# Patient Record
Sex: Male | Born: 1975
Health system: Southern US, Community
[De-identification: ages and names within clinical notes are randomized; demographics above are authoritative.]

## PROBLEM LIST (undated history)

## (undated) HISTORY — PX: WISDOM TOOTH EXTRACTION: SHX21

## (undated) HISTORY — PX: HERNIA REPAIR: SHX51

---

## 2014-03-15 ENCOUNTER — Ambulatory Visit (INDEPENDENT_AMBULATORY_CARE_PROVIDER_SITE_OTHER): Payer: 59 | Admitting: Family Medicine

## 2014-03-15 VITALS — BP 130/70 | HR 79 | Temp 98.2°F | Resp 16 | Ht 69.0 in | Wt 170.0 lb

## 2014-03-15 DIAGNOSIS — R590 Localized enlarged lymph nodes: Secondary | ICD-10-CM

## 2014-03-15 DIAGNOSIS — R599 Enlarged lymph nodes, unspecified: Secondary | ICD-10-CM

## 2014-03-15 LAB — POCT UA - MICROSCOPIC ONLY
Bacteria, U Microscopic: NEGATIVE
Casts, Ur, LPF, POC: NEGATIVE
Crystals, Ur, HPF, POC: NEGATIVE
Mucus, UA: NEGATIVE
RBC, urine, microscopic: NEGATIVE
Yeast, UA: NEGATIVE

## 2014-03-15 LAB — POCT SEDIMENTATION RATE: POCT SED RATE: 40 mm/hr — AB (ref 0–22)

## 2014-03-15 LAB — POCT CBC
Granulocyte percent: 61 %G (ref 37–80)
HCT, POC: 45 % (ref 43.5–53.7)
Hemoglobin: 14.1 g/dL (ref 14.1–18.1)
Lymph, poc: 1.6 (ref 0.6–3.4)
MCH, POC: 26.3 pg — AB (ref 27–31.2)
MCHC: 31.4 g/dL — AB (ref 31.8–35.4)
MCV: 83.8 fL (ref 80–97)
MID (cbc): 0.4 (ref 0–0.9)
MPV: 9.1 fL (ref 0–99.8)
POC Granulocyte: 3.1 (ref 2–6.9)
POC LYMPH PERCENT: 31.9 %L (ref 10–50)
POC MID %: 7.1 %M (ref 0–12)
Platelet Count, POC: 150 10*3/uL (ref 142–424)
RBC: 5.37 M/uL (ref 4.69–6.13)
RDW, POC: 14.4 %
WBC: 5.1 10*3/uL (ref 4.6–10.2)

## 2014-03-15 LAB — POCT URINALYSIS DIPSTICK
Bilirubin, UA: NEGATIVE
Blood, UA: NEGATIVE
Glucose, UA: NEGATIVE
Ketones, UA: NEGATIVE
Leukocytes, UA: NEGATIVE
Nitrite, UA: NEGATIVE
Protein, UA: NEGATIVE
Spec Grav, UA: 1.02
Urobilinogen, UA: 0.2
pH, UA: 7

## 2014-03-15 NOTE — Progress Notes (Signed)
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  He recently returned from vacation in [place].  No sore throat, cough, chest pain, abdominal symptoms, or urinary symptoms.   No ongoing medical problems  Objective:  NAD HEENT:  Normal Neck: supple, adenopathy Chest:  Clear Heart: reg, no murmur or rub Abdomen:  Soft, nontender with no HSM, marked left inguinal adenopathy, right inguinal hernia scar. Results for orders placed in visit on 03/15/14  POCT CBC      Result Value Ref Range   WBC 5.1  4.6 - 10.2 K/uL   Lymph, poc 1.6  0.6 - 3.4   POC LYMPH PERCENT 31.9  10 - 50 %L   MID (cbc) 0.4  0 - 0.9   POC MID % 7.1  0 - 12 %M   POC Granulocyte 3.1  2 - 6.9   Granulocyte percent 61.0  37 - 80 %G   RBC 5.37  4.69 - 6.13 M/uL   Hemoglobin 14.1  14.1 - 18.1 g/dL   HCT, POC 16.1  09.6 - 53.7 %   MCV 83.8  80 - 97 fL   MCH, POC 26.3 (*) 27 - 31.2 pg   MCHC 31.4 (*) 31.8 - 35.4 g/dL   RDW, POC 04.5     Platelet Count, POC 150  142 - 424 K/uL   MPV 9.1  0 - 99.8 fL  POCT URINALYSIS DIPSTICK      Result Value Ref Range   Color, UA yellow     Clarity, UA clear     Glucose, UA neg     Bilirubin, UA neg     Ketones, UA neg     Spec Grav, UA 1.020     Blood, UA neg     pH, UA 7.0     Protein, UA neg     Urobilinogen, UA 0.2     Nitrite, UA neg     Leukocytes, UA Negative    POCT UA - MICROSCOPIC ONLY      Result Value Ref Range   WBC, Ur, HPF, POC 0-1     RBC, urine, microscopic neg     Bacteria, U Microscopic neg     Mucus, UA neg     Epithelial cells, urine per micros 0-1     Crystals, Ur, HPF, POC neg     Casts, Ur, LPF, POC neg     Yeast, UA neg      Assessment:  Unexplained chills and lymphadenopathy.  Lymphadenopathy, abdominal - Plan: POCT CBC, POCT SEDIMENTATION RATE, Comprehensive metabolic panel, POCT urinalysis dipstick, POCT UA - Microscopic Only, US Abdomen Limited  Signed, Elvina Sidle, MD

## 2014-03-15 NOTE — Patient Instructions (Signed)

## 2014-03-16 ENCOUNTER — Other Ambulatory Visit: Payer: Self-pay | Admitting: Family Medicine

## 2014-03-16 ENCOUNTER — Ambulatory Visit
Admission: RE | Admit: 2014-03-16 | Discharge: 2014-03-16 | Disposition: A | Discharge status: Home / Self Care | Payer: 59 | Source: Ambulatory Visit | Attending: Family Medicine | Admitting: Family Medicine

## 2014-03-16 DIAGNOSIS — R590 Localized enlarged lymph nodes: Secondary | ICD-10-CM

## 2014-03-16 LAB — COMPREHENSIVE METABOLIC PANEL
ALT: 26 U/L (ref 0–53)
AST: 26 U/L (ref 0–37)
Albumin: 4.2 g/dL (ref 3.5–5.2)
Alkaline Phosphatase: 59 U/L (ref 39–117)
BUN: 14 mg/dL (ref 6–23)
CO2: 29 mEq/L (ref 19–32)
Calcium: 9.4 mg/dL (ref 8.4–10.5)
Chloride: 104 mEq/L (ref 96–112)
Creat: 0.97 mg/dL (ref 0.50–1.35)
Glucose, Bld: 86 mg/dL (ref 70–99)
Potassium: 4 mEq/L (ref 3.5–5.3)
Sodium: 139 mEq/L (ref 135–145)
Total Bilirubin: 0.4 mg/dL (ref 0.2–1.2)
Total Protein: 7.5 g/dL (ref 6.0–8.3)

## 2014-03-20 ENCOUNTER — Other Ambulatory Visit: Payer: Self-pay | Admitting: Radiology

## 2014-03-20 DIAGNOSIS — R590 Localized enlarged lymph nodes: Secondary | ICD-10-CM

## 2014-03-22 ENCOUNTER — Other Ambulatory Visit: Payer: Self-pay | Admitting: Family Medicine

## 2014-03-22 ENCOUNTER — Other Ambulatory Visit: Payer: Self-pay

## 2014-03-22 DIAGNOSIS — R590 Localized enlarged lymph nodes: Secondary | ICD-10-CM

## 2014-03-24 ENCOUNTER — Other Ambulatory Visit: Payer: Self-pay | Admitting: Radiology

## 2014-03-24 ENCOUNTER — Encounter (HOSPITAL_COMMUNITY): Payer: Self-pay | Admitting: Pharmacy Technician

## 2014-03-27 ENCOUNTER — Other Ambulatory Visit: Payer: Self-pay | Admitting: Radiology

## 2014-03-29 ENCOUNTER — Other Ambulatory Visit: Payer: Self-pay | Admitting: Radiology

## 2014-03-30 ENCOUNTER — Other Ambulatory Visit: Payer: Self-pay | Admitting: Family Medicine

## 2014-03-30 ENCOUNTER — Encounter (HOSPITAL_COMMUNITY): Payer: Self-pay

## 2014-03-30 ENCOUNTER — Ambulatory Visit (HOSPITAL_COMMUNITY)
Admission: RE | Admit: 2014-03-30 | Discharge: 2014-03-30 | Disposition: A | Discharge status: Home / Self Care | Payer: 59 | Source: Ambulatory Visit | Attending: Family Medicine | Admitting: Family Medicine

## 2014-03-30 DIAGNOSIS — R599 Enlarged lymph nodes, unspecified: Secondary | ICD-10-CM | POA: Insufficient documentation

## 2014-03-30 DIAGNOSIS — R590 Localized enlarged lymph nodes: Secondary | ICD-10-CM

## 2014-03-30 LAB — CBC
HCT: 41.8 % (ref 39.0–52.0)
Hemoglobin: 13.9 g/dL (ref 13.0–17.0)
MCH: 26.7 pg (ref 26.0–34.0)
MCHC: 33.3 g/dL (ref 30.0–36.0)
MCV: 80.2 fL (ref 78.0–100.0)
Platelets: 170 10*3/uL (ref 150–400)
RBC: 5.21 MIL/uL (ref 4.22–5.81)
RDW: 13.3 % (ref 11.5–15.5)
WBC: 3.3 10*3/uL — AB (ref 4.0–10.5)

## 2014-03-30 LAB — PROTIME-INR
INR: 1 (ref 0.00–1.49)
PROTHROMBIN TIME: 13.2 s (ref 11.6–15.2)

## 2014-03-30 LAB — APTT: aPTT: 33 seconds (ref 24–37)

## 2014-03-30 MED ORDER — FENTANYL CITRATE 0.05 MG/ML IJ SOLN
INTRAMUSCULAR | Status: AC
  Filled 2014-03-30: qty 2

## 2014-03-30 MED ORDER — LIDOCAINE HCL (PF) 1 % IJ SOLN
INTRAMUSCULAR | Status: AC
  Filled 2014-03-30: qty 5

## 2014-03-30 MED ORDER — MIDAZOLAM HCL 2 MG/2ML IJ SOLN
INTRAMUSCULAR | Status: AC | PRN
  Administered 2014-03-30 (×2): 1 mg via INTRAVENOUS

## 2014-03-30 MED ORDER — FENTANYL CITRATE 0.05 MG/ML IJ SOLN
INTRAMUSCULAR | Status: AC | PRN
  Administered 2014-03-30: 50 ug via INTRAVENOUS

## 2014-03-30 MED ORDER — LIDOCAINE HCL (PF) 1 % IJ SOLN
INTRAMUSCULAR | Status: AC
  Filled 2014-03-30: qty 10

## 2014-03-30 MED ORDER — MIDAZOLAM HCL 2 MG/2ML IJ SOLN
INTRAMUSCULAR | Status: AC
  Filled 2014-03-30: qty 4

## 2014-03-30 MED ORDER — SODIUM CHLORIDE 0.9 % IV SOLN
Freq: Once | INTRAVENOUS | Status: AC
  Administered 2014-03-30: 09:00:00 via INTRAVENOUS

## 2014-03-30 NOTE — Discharge Instructions (Signed)
Needle Biopsy °Care After °These instructions give you information on caring for yourself after your procedure. Your doctor may also give you more specific instructions. Call your doctor if you have any problems or questions after your procedure. °HOME CARE °· Rest for 4 hours after your biopsy, except for getting up to go to the bathroom or as told. °· Keep the places where the needles were put in clean and dry. °¨ Do not put powder or lotion on the sites. °¨ Do not shower until 24 hours after the test. Remove all bandages (dressings) before showering. °¨ Remove all bandages at least once every day. Gently clean the sites with soap and water. Keep putting a new bandage on until the skin is closed. °Finding out the results of your test °Ask your doctor when your test results will be ready. Make sure you follow up and get the test results. °GET HELP RIGHT AWAY IF:  °· You have shortness of breath or trouble breathing. °· You have pain or cramping in your belly (abdomen). °· You feel sick to your stomach (nauseous) or throw up (vomit). °· Any of the places where the needles were put in: °¨ Are puffy (swollen) or red. °¨ Are sore or hot to the touch. °¨ Are draining yellowish-white fluid (pus). °¨ Are bleeding after 10 minutes of pressing down on the site. Have someone keep pressing on any place that is bleeding until you see a doctor. °· You have any unusual pain that will not stop. °· You have a fever. °If you go to the emergency room, tell the nurse that you had a biopsy. Take this paper with you to show the nurse. °MAKE SURE YOU:  °· Understand these instructions. °· Will watch your condition. °· Will get help right away if you are not doing well or get worse. °Document Released: 06/05/2008 Document Revised: 09/15/2011 Document Reviewed: 06/05/2008 °ExitCare® Patient Information ©2015 [employer]. This information is not intended to replace advice given to you by your health care provider. Make sure you discuss  any questions you have with your health care provider. ° °

## 2014-03-30 NOTE — Procedures (Signed)
Procedure: US guided bx/culture of left inguinal lymph node Findings: 3.7cm left inguinal lymph node, architecture maintained by [place].  4 x 18G core biopsy.  2 to path lab, 2 for culture No complication No significant blood loss Signed,  Yvone Neu. Loreta Ave, DO

## 2014-03-30 NOTE — H&P (Signed)
Chief Complaint: "I am here for a biopsy."  Referring Physician(s): Lauenstein,Kurt  History of Present Illness: Jon Young is a 38 y.o. male who c/o left groin swelling starting prior to august 28th before he went on a trip to [place]. He returned from his trip around sept 9th and had 1 week of chills which have now resolved, however continued left groin swelling. He denies any weight loss, appetite change, or night sweats. He underwent a pelvic US and revealed left inguinal adenopathy. He is scheduled today for an US guided lymph node biopsy. He denies any chest pain, shortness of breath or palpitations. He denies any active signs of bleeding or excessive bruising. He denies any recent fever or chills. The patient denies any history of sleep apnea or chronic oxygen use. He has previously tolerated anesthesia during a hernia operation without complications.   History reviewed. No pertinent past medical history.  Past Surgical History  Procedure Laterality Date  . Hernia repair    . Wisdom tooth extraction      Allergies: Review of patient's allergies indicates no known allergies.  Medications: Prior to Admission medications   Medication Sig Start Date End Date Taking? Authorizing Provider  acetaminophen (TYLENOL) 500 MG tablet Take 500 mg by mouth every 4 (four) hours as needed for mild pain, fever or headache.    Yes Historical Provider, MD    Family History  Problem Relation Age of Onset  . Hypertension Mother     History   Social History  . Marital Status: Divorced    Spouse Name: N/A    Number of Children: N/A  . Years of Education: N/A   Social History Main Topics  . Smoking status: Never Smoker   . Smokeless tobacco: None  . Alcohol Use: Yes  . Drug Use: No  . Sexual Activity: None   Other Topics Concern  . None   Social History Narrative  . None   Review of Systems: A 12 point ROS discussed and pertinent positives are indicated in the HPI above.  All  other systems are negative.  Review of Systems  Constitutional: Positive for chills. Negative for fever, appetite change and unexpected weight change.  Respiratory: Negative for cough and shortness of breath.   Cardiovascular: Negative for chest pain.  Gastrointestinal: Negative for abdominal pain and blood in stool.  Genitourinary: Negative for hematuria.    Vital Signs: BP 127/45  Pulse 67  Temp(Src) 97.6 F (36.4 C) (Oral)  Resp 20  Ht  (1.753 m)  Wt 170 lb (77.111 kg)  BMI 25.09 kg/m2  SpO2 100%  Physical Exam  Constitutional: He is oriented to person, place, and time. He appears well-developed and well-nourished. No distress.  HENT:  Head: Normocephalic and atraumatic.  Neck: Neck supple. No tracheal deviation present.  Cardiovascular: Normal rate and regular rhythm.  Exam reveals no gallop and no friction rub.   No murmur heard. Pulmonary/Chest: Effort normal and breath sounds normal. No respiratory distress. He has no wheezes. He has no rales.  Abdominal: Soft. Bowel sounds are normal. He exhibits no distension. There is no tenderness.  Palpable left inguinal adenopathy   Lymphadenopathy:    He has no cervical adenopathy.  Neurological: He is alert and oriented to person, place, and time.  Skin: Skin is warm and dry. He is not diaphoretic.  Psychiatric: He has a normal mood and affect. His behavior is normal. Thought content normal.    Imaging: US Pelvis Limited  03/16/2014   CLINICAL DATA:  Left inguinal adenopathy, recent foreign travel, some chills  EXAM: [place] PELVIS LIMITED  TECHNIQUE: Ultrasound examination of the pelvic soft tissues was performed in the area of clinical concern.  COMPARISON:  None.  FINDINGS: Ultrasound over the left inguinal region was performed. At the site of the palpable abnormality several enlarged lymph nodes are present. The largest of these nodes measures 3.8 x 1.3 x 1.9 cm with a second node measuring 2.9 x 1.1 x 1.7 cm. A third  prominent node measures 1.7 x 1.4 x 2.3 cm. These nodes do demonstrate significant blood flow. I would favor an inflammatory process, although malignancy cannot be excluded. If these nodes persist or enlarge, then biopsy may be warranted.  IMPRESSION: The palpable abnormality in the left inguinal region represents enlarged lymph nodes. An inflammatory etiology is favored. However, recommend clinical followup and biopsy if these nodes persist or enlarge.   Electronically Signed   By: Dwyane Dee M.D.   On: 03/16/2014 15:51    Labs: Lab Results  Component Value Date   WBC 5.1 03/15/2014   HCT 45.0 03/15/2014   MCV 83.8 03/15/2014   NA 139 03/15/2014   K 4.0 03/15/2014   CL 104 03/15/2014   CO2 29 03/15/2014   GLUCOSE 86 03/15/2014   BUN 14 03/15/2014   CREATININE 0.97 03/15/2014   CALCIUM 9.4 03/15/2014   PROT 7.5 03/15/2014   ALBUMIN 4.2 03/15/2014   AST 26 03/15/2014   ALT 26 03/15/2014   ALKPHOS 59 03/15/2014   BILITOT 0.4 03/15/2014    Assessment and Plan: Left inguinal adenopathy s/p pelvic [place] Chills x 1 week Recent travel Scheduled today for image guided left inguinal lymph node biopsy with moderate sedation Patient has been NPO, no blood thinners, labs reviewed. Risks and Benefits discussed with the patient. All of the patient's questions were answered, patient is agreeable to proceed. Consent signed and in chart.    SignedBerneta Levins 03/30/2014, 9:05 AM

## 2014-04-02 LAB — TISSUE CULTURE: Culture: NO GROWTH

## 2014-05-12 LAB — AFB CULTURE WITH SMEAR (NOT AT ARMC): ACID FAST SMEAR: NONE SEEN

## 2014-12-05 IMAGING — US US BIOPSY
1 series · 13 of 15 positions shown · non-contrast
Comparison: none

CLINICAL DATA: 37 year old male with enlarged left inguinal lymph
node

[Series 1: us biopsy · 0.06mm/px · 13 of 15 slices shown]
[im 1/15]
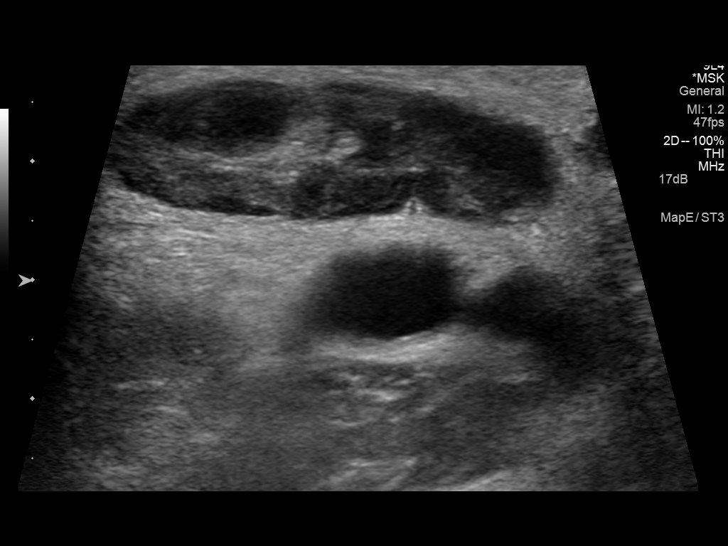
[im 2/15]
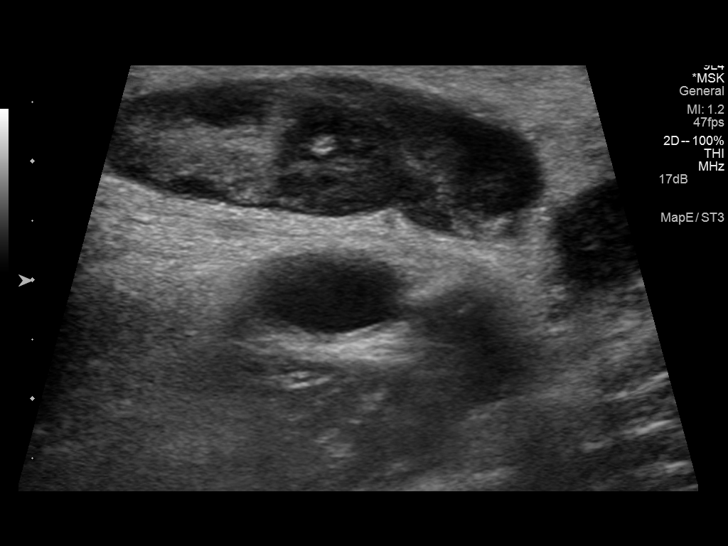
[im 3/15]
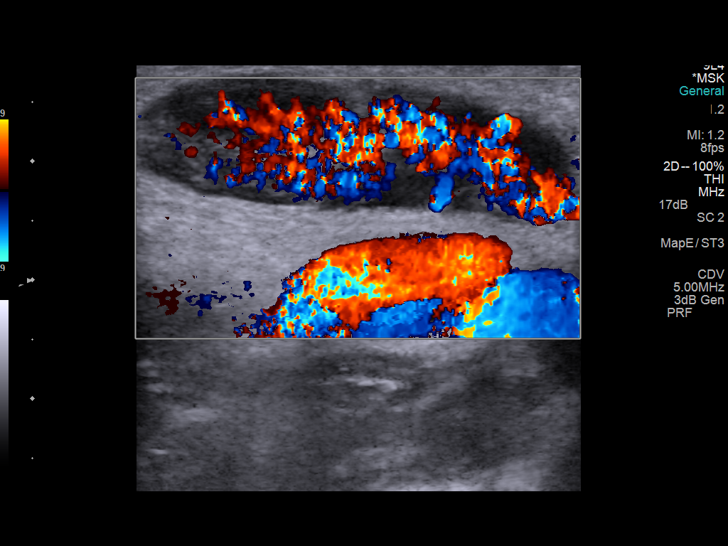
[im 5/15]
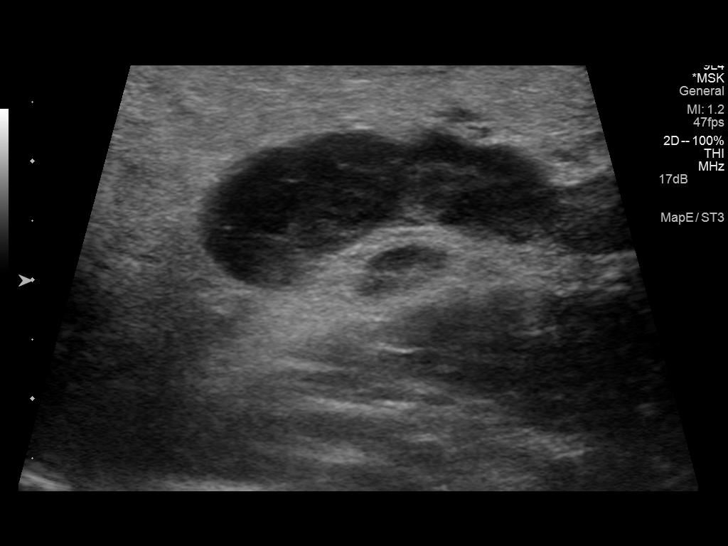
[im 6/15]
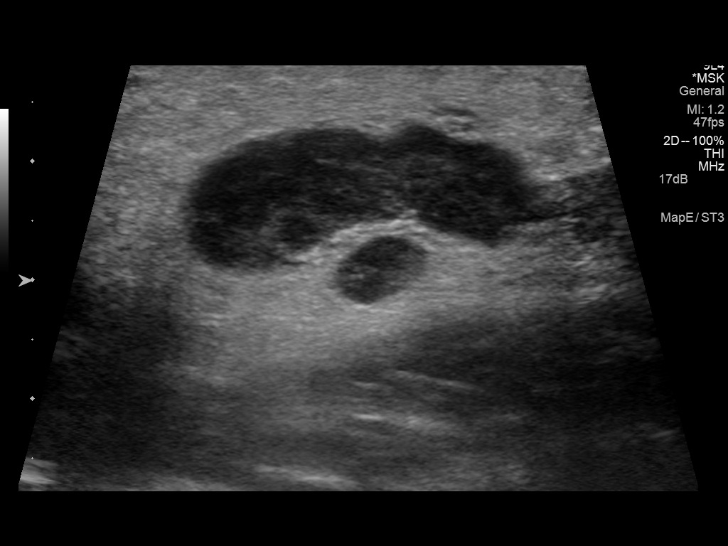
[im 7/15]
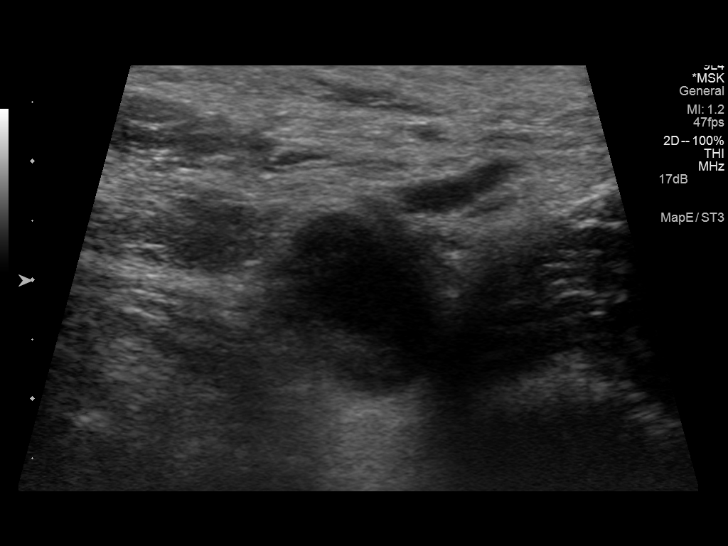
[im 8/15]
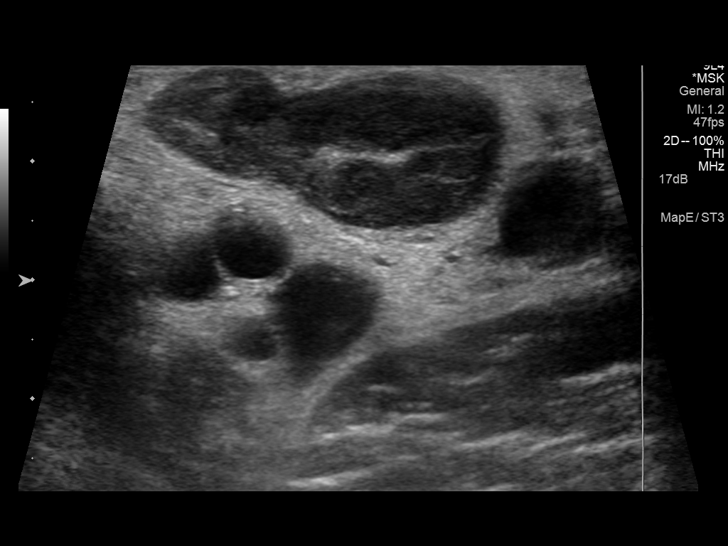
[im 9/15]
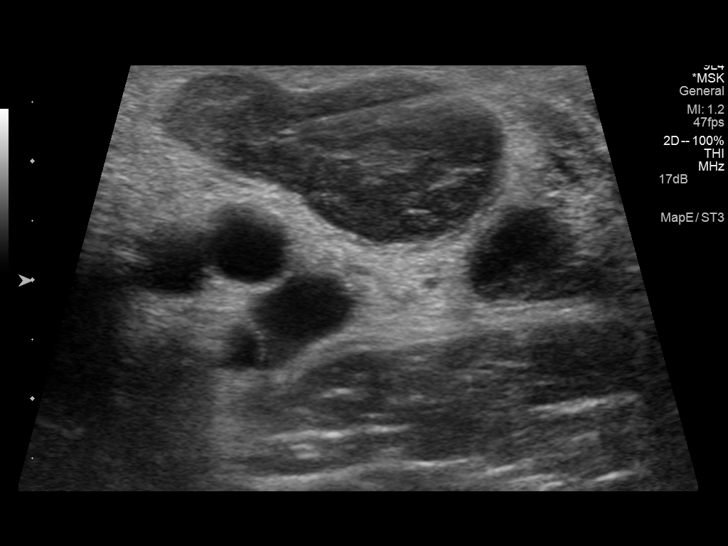
[im 10/15]
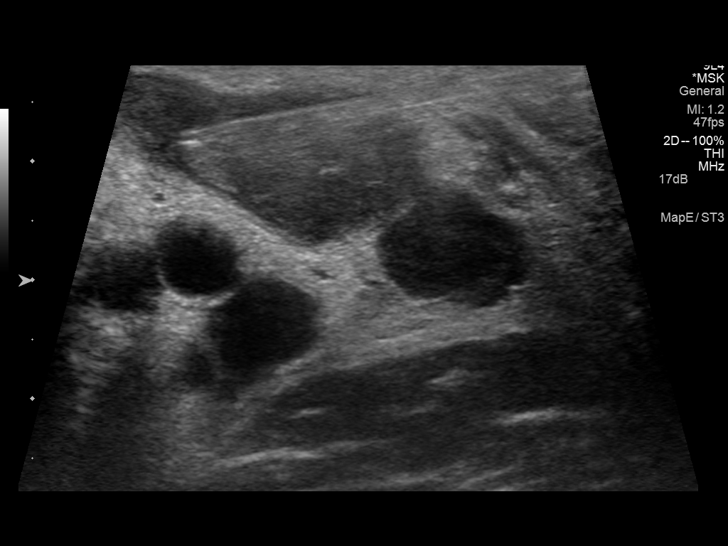
[im 11/15]
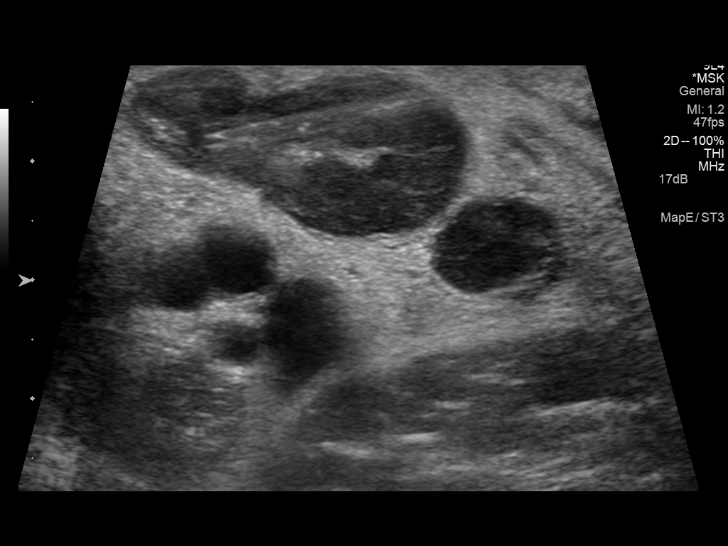
[im 13/15]
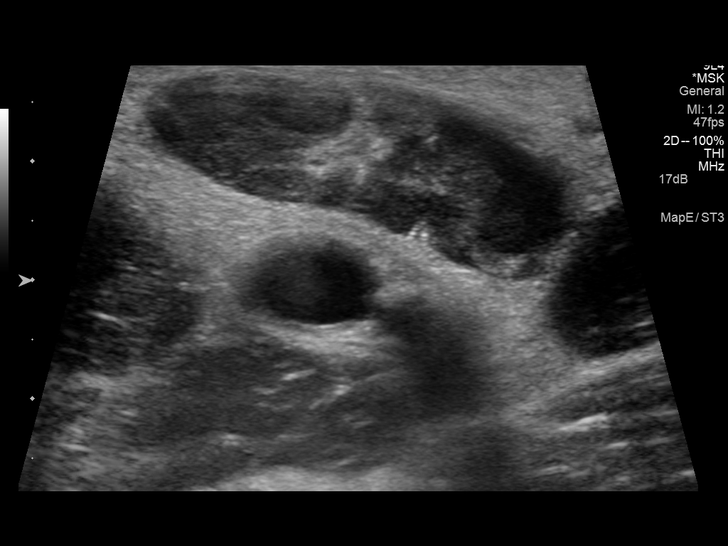
[im 14/15]
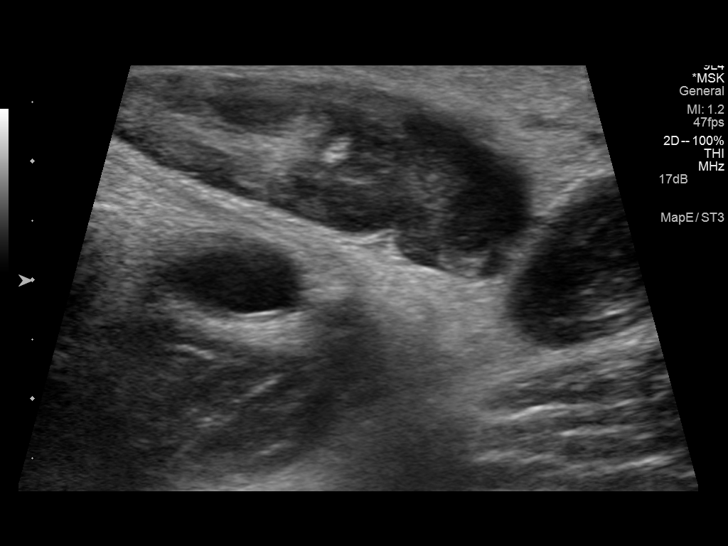
[im 15/15]
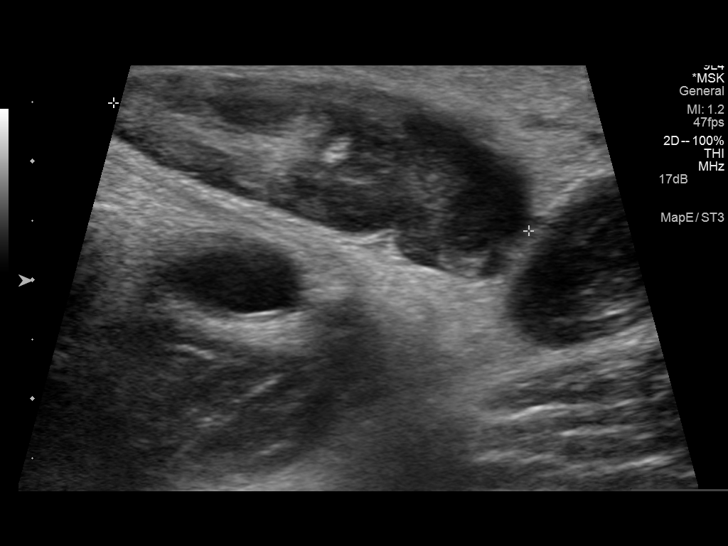

[13 of 15 positions shown; findings below may reference images not displayed]

EXAM:
ULTRASOUND GUIDED CORE BIOPSY OF LEFT INGUINAL LYMPH NODE

MEDICATIONS:
2 mg IV Versed; 50 mcg IV Fentanyl

Total Moderate Sedation Time: 18

PROCEDURE:
The procedure, risks, benefits, and alternatives were explained to
the patient. Questions regarding the procedure were encouraged and
answered. The patient understands and consents to the procedure.

Ultrasound survey of the left inguinal region was performed with
images stored and sent to PACs.

The left inguinal region was prepped with Betadine in a sterile
fashion, and a sterile drape was applied covering the operative
field. A sterile gown and sterile gloves were used for the
procedure. Local anesthesia was provided with 1% Lidocaine.

Once the skin and subcutaneous tissues were generously infiltrated
with 1% lidocaine local anesthesia, a small stab incision was made
with 11 blade scalpel. An 18 gauge core biopsy gun was advanced
under ultrasound guidance into the left inguinal lymph node. Four
separate 18 gauge core biopsy were then retrieved, all under
ultrasound observation, with 2 biopsy sent to pathology, and 2
biopsy sent to lab for culture.

The patient tolerated the procedure well and remained
hemodynamically stable throughout.

No complications were encounter no blood loss was encountered.

COMPLICATIONS:
None.
FINDINGS: Left inguinal lymph node measures as great as 3.8 cm.

Images during the case demonstrate ultrasound guided core biopsy.
IMPRESSION: Status post left inguinal lymph node core biopsy, with tissue
specimen sent to pathology for complete histopathologic analysis, as
well as to the lab for culture.

## 2015-08-24 DIAGNOSIS — J029 Acute pharyngitis, unspecified: Secondary | ICD-10-CM | POA: Diagnosis not present

## 2016-08-08 DIAGNOSIS — H52223 Regular astigmatism, bilateral: Secondary | ICD-10-CM | POA: Diagnosis not present

## 2016-09-08 ENCOUNTER — Ambulatory Visit (HOSPITAL_COMMUNITY)
Admission: EM | Admit: 2016-09-08 | Discharge: 2016-09-08 | Disposition: A | Discharge status: Home / Self Care | Payer: 59 | Attending: Internal Medicine | Admitting: Internal Medicine

## 2016-09-08 ENCOUNTER — Encounter (HOSPITAL_COMMUNITY): Payer: Self-pay | Admitting: Emergency Medicine

## 2016-09-08 DIAGNOSIS — J069 Acute upper respiratory infection, unspecified: Secondary | ICD-10-CM

## 2016-09-08 DIAGNOSIS — B9789 Other viral agents as the cause of diseases classified elsewhere: Secondary | ICD-10-CM | POA: Diagnosis not present

## 2016-09-08 MED ORDER — BENZONATATE 100 MG PO CAPS: 100.0000 mg | ORAL_CAPSULE | Freq: Three times a day (TID) | ORAL | 0 refills | Status: DC

## 2016-09-08 MED FILL — BENZONATATE 100 MG CAP: 100 | 7 days supply | Qty: 21 | Fill #0

## 2016-09-08 NOTE — Discharge Instructions (Addendum)
You most likely have a viral URI, I advise rest, plenty of fluids and management of symptoms with over the counter medicines. For symptoms you may take Tylenol as needed every 4-6 hours for body aches or fever, not to exceed 4,000 mg a day, Take mucinex or mucinex DM ever 12 hours with a full glass of water, you may use an inhaled steroid such as Flonase, 2 sprays each nostril once a day for congestion, or an antihistamine such as Claritin or Zyrtec once a day. For cough, I have prescribed a medication called Tessalon. Take 1 tablet every 8 hours as needed for your cough. Should your symptoms worsen or fail to resolve, follow up with your primary care provider or return to clinic.  °

## 2016-09-08 NOTE — ED Triage Notes (Signed)
See s/s.  Has cough and headache.  This morning, cough is loosening.  Patient had chills in the night, general bodyaches

## 2016-09-08 NOTE — ED Provider Notes (Signed)
CSN: [id number]     Arrival date & time 09/08/16  1003 History   First MD Initiated Contact with Patient 09/08/16 1037     Chief Complaint  Patient presents with  . URI   (Consider location/radiation/quality/duration/timing/severity/associated sxs/prior Treatment) 41 year old male presents with 24-hour history of a dry, hacking, cough along with congestion. He reports having what he believes to be a low-grade fever at home, with some chills, has not taken his temperature. He's had no loss of appetite, no muscle aches, no bodyaches, no headaches, no nausea, vomiting, diarrhea, or abdominal pain. He does not smoke, has no history of lung disease, he has had a influenza vaccine this year.   The history is provided by the patient.  URI    History reviewed. No pertinent past medical history. Past Surgical History:  Procedure Laterality Date  . HERNIA REPAIR    . WISDOM TOOTH EXTRACTION     Family History  Problem Relation Age of Onset  . Hypertension Mother    Social History  Substance Use Topics  . Smoking status: Never Smoker  . Smokeless tobacco: Not on file  . Alcohol use Yes    Review of Systems  Reason unable to perform ROS: as covered in HPI.  All other systems reviewed and are negative.   Allergies  Patient has no known allergies.  Home Medications   Prior to Admission medications   Medication Sig Start Date End Date Taking? Authorizing Provider  guaiFENesin (MUCINEX) 600 MG 12 hr tablet Take by mouth 2 (two) times daily.   Yes Historical Provider, MD  acetaminophen (TYLENOL) 500 MG tablet Take 500 mg by mouth every 4 (four) hours as needed for mild pain, fever or headache.     Historical Provider, MD  benzonatate (TESSALON) 100 MG capsule Take 1 capsule (100 mg total) by mouth every 8 (eight) hours. 09/08/16   Dorena Bodo, NP   Meds Ordered and Administered this Visit  Medications - No data to display  BP 114/81 (BP Location: Right Arm) Comment: large cuff   Pulse 70   Temp 98.8 F (37.1 C) (Oral)   Resp 16   SpO2 100%  No data found.   Physical Exam  Constitutional: He is oriented to person, place, and time. He appears well-developed and well-nourished. He does not have a sickly appearance. He does not appear ill. No distress.  HENT:  Head: Normocephalic and atraumatic.  Right Ear: Tympanic membrane and external ear normal.  Left Ear: Tympanic membrane and external ear normal.  Nose: Nose normal. Right sinus exhibits no maxillary sinus tenderness and no frontal sinus tenderness. Left sinus exhibits no maxillary sinus tenderness and no frontal sinus tenderness.  Mouth/Throat: Uvula is midline and oropharynx is clear and moist. No oropharyngeal exudate.  Eyes: Pupils are equal, round, and reactive to light.  Neck: Normal range of motion. Neck supple. No JVD present.  Cardiovascular: Normal rate and regular rhythm.   Pulmonary/Chest: Effort normal and breath sounds normal. No respiratory distress. He has no wheezes.  Abdominal: Soft. Bowel sounds are normal.  Lymphadenopathy:       Head (right side): No submental, no submandibular, no tonsillar and no preauricular adenopathy present.       Head (left side): No submental, no submandibular, no tonsillar and no preauricular adenopathy present.    He has no cervical adenopathy.  Neurological: He is alert and oriented to person, place, and time.  Skin: Skin is warm and dry. Capillary refill takes less than  2 seconds. No rash noted. He is not diaphoretic. No erythema.  Psychiatric: He has a normal mood and affect.  Nursing note and vitals reviewed.   Urgent Care Course     Procedures (including critical care time)  Labs Review Labs Reviewed - No data to display  Imaging Review No results found.      MDM   1. Viral upper respiratory tract infection   2. Viral URI with cough    You most likely have a viral URI, I advise rest, plenty of fluids and management of symptoms with over  the counter medicines. For symptoms you may take Tylenol as needed every 4-6 hours for body aches or fever, not to exceed 4,000 mg a day, Take mucinex or mucinex DM ever 12 hours with a full glass of water, you may use an inhaled steroid such as Flonase, 2 sprays each nostril once a day for congestion, or an antihistamine such as Claritin or Zyrtec once a day. For cough, I have prescribed a medication called Tessalon. Take 1 tablet every 8 hours as needed for your cough. Should your symptoms worsen or fail to resolve, follow up with your primary care provider or return to clinic.     Dorena BodoLawrence Nike Southers, NP 09/08/16 1051

## 2016-11-14 ENCOUNTER — Ambulatory Visit (HOSPITAL_COMMUNITY)
Admission: EM | Admit: 2016-11-14 | Discharge: 2016-11-14 | Disposition: A | Discharge status: Home / Self Care | Payer: 59 | Attending: Internal Medicine | Admitting: Internal Medicine

## 2016-11-14 ENCOUNTER — Encounter (HOSPITAL_COMMUNITY): Payer: Self-pay | Admitting: *Deleted

## 2016-11-14 DIAGNOSIS — A084 Viral intestinal infection, unspecified: Secondary | ICD-10-CM

## 2016-11-14 MED ORDER — DICYCLOMINE HCL 20 MG PO TABS: 20.0000 mg | ORAL_TABLET | Freq: Two times a day (BID) | ORAL | 0 refills | Status: AC

## 2016-11-14 MED ORDER — OMEPRAZOLE 40 MG PO CPDR: 40.0000 mg | DELAYED_RELEASE_CAPSULE | Freq: Two times a day (BID) | ORAL | 0 refills | Status: AC

## 2016-11-14 MED ORDER — ONDANSETRON 4 MG PO TBDP: 4.0000 mg | ORAL_TABLET | Freq: Three times a day (TID) | ORAL | 0 refills | Status: AC | PRN

## 2016-11-14 MED FILL — OMEPRAZOLE DR 40 MG CAPSULE: 40 | 14 days supply | Qty: 28 | Fill #0

## 2016-11-14 MED FILL — DICYCLOMINE 20 MG TABLET: 20 | 10 days supply | Qty: 20 | Fill #0

## 2016-11-14 NOTE — ED Triage Notes (Signed)
abd  Pain  And  Fatigue     No  Nausea      No  Vomiting       No  Recent  Symptoms   Worse  Last  Night  Pain is  Worse  After  Eating

## 2016-11-14 NOTE — ED Provider Notes (Signed)
CSN: [id number]     Arrival date & time 11/14/16  1525 History   First MD Initiated Contact with Patient 11/14/16 1535     Chief Complaint  Patient presents with  . Abdominal Pain   (Consider location/radiation/quality/duration/timing/severity/associated sxs/prior Treatment) The history is provided by the patient.  Abdominal Pain  Pain location:  Epigastric Pain quality: aching, cramping and gnawing   Pain radiates to:  Does not radiate Pain severity:  Moderate Onset quality:  Gradual Duration:  1 day Timing:  Constant Progression:  Unchanged Chronicity:  New Context: eating   Context: not diet changes, not recent illness and not suspicious food intake   Relieved by:  Nothing Worsened by:  Nothing Ineffective treatments:  None tried Associated symptoms: anorexia, chills, fatigue and nausea   Associated symptoms: no chest pain, no constipation, no cough, no diarrhea, no fever, no shortness of breath and no vomiting     History reviewed. No pertinent past medical history. Past Surgical History:  Procedure Laterality Date  . HERNIA REPAIR    . WISDOM TOOTH EXTRACTION     Family History  Problem Relation Age of Onset  . Hypertension Mother    Social History  Substance Use Topics  . Smoking status: Never Smoker  . Smokeless tobacco: Never Used  . Alcohol use Yes    Review of Systems  Constitutional: Positive for chills and fatigue. Negative for fever.  HENT: Negative.   Respiratory: Negative for cough and shortness of breath.   Cardiovascular: Negative for chest pain and palpitations.  Gastrointestinal: Positive for abdominal pain, anorexia and nausea. Negative for constipation, diarrhea and vomiting.  Musculoskeletal: Negative.   Skin: Negative.   Neurological: Negative.     Allergies  Patient has no known allergies.  Home Medications   Prior to Admission medications   Medication Sig Start Date End Date Taking? Authorizing Provider  dicyclomine (BENTYL) 20  MG tablet Take 1 tablet (20 mg total) by mouth 2 (two) times daily. 11/14/16   Dorena BodoKennard, Yordan Martindale, NP  omeprazole (PRILOSEC) 40 MG capsule Take 1 capsule (40 mg total) by mouth 2 (two) times daily. 11/14/16 11/28/16  Dorena BodoKennard, Yamile Roedl, NP  ondansetron (ZOFRAN ODT) 4 MG disintegrating tablet Take 1 tablet (4 mg total) by mouth every 8 (eight) hours as needed for nausea or vomiting. 11/14/16   Dorena BodoKennard, Gabrial Poppell, NP   Meds Ordered and Administered this Visit  Medications - No data to display  BP (!) 113/47 (BP Location: Right Arm)   Temp 98.9 F (37.2 C) (Oral)   Resp 16   SpO2 98%  No data found.   Physical Exam  Constitutional: He is oriented to person, place, and time. He appears well-developed and well-nourished. No distress.  HENT:  Head: Normocephalic and atraumatic.  Right Ear: External ear normal.  Left Ear: External ear normal.  Eyes: Conjunctivae are normal.  Cardiovascular: Normal rate and regular rhythm.   Pulmonary/Chest: Effort normal and breath sounds normal.  Abdominal: Normal appearance. Bowel sounds are increased. There is tenderness in the epigastric area. There is no rebound, no CVA tenderness, no tenderness at McBurney's point and negative Murphy's sign.  Neurological: He is alert and oriented to person, place, and time.  Skin: Skin is warm and dry. Capillary refill takes less than 2 seconds. He is not diaphoretic.  Psychiatric: He has a normal mood and affect. His behavior is normal.  Nursing note and vitals reviewed.   Urgent Care Course     Procedures (including critical care time)  Labs Review Labs Reviewed - No data to display  Imaging Review No results found.    MDM   1. Viral gastroenteritis    Most likely viral gastroenteritis, given Zofran, Bentyl, Prilosec, provided counseling on symptom management, recommended clear liquid diet for next 24 hours, slowly reintroduce solid foods such as bananas, rice, toast. Symptoms persist, or any way worsen,  return to clinic, or go to the ER.     Dorena Bodo, NP 11/14/16 1554

## 2016-11-14 NOTE — Discharge Instructions (Signed)
Your symptoms are consistent with viral gastritis. I prescribed medications to help your symptoms.For Nausea, I have prescribed Zofran, take 1 tablet under the tongue every 8 hours as needed. I prescribed a medicine called Bentyl, take one tablet by mouth twice daily. And finally have prescribed a medicine called omeprazole, take 1 tablet by mouth twice daily. Should your symptoms persist, or fail to improve, follow up with your primary care provider or return to clinic as needed. If your symptoms worsen, particularly if you develop worsened abdominal pain, fever, signs or symptoms of severe dehydration, then go to the emergency room.
